# Patient Record
Sex: Female | Born: 2000 | Race: Black or African American | Hispanic: No | Marital: Single | Smoking: Former smoker
Health system: Southern US, Community
[De-identification: ages and names within clinical notes are randomized; demographics above are authoritative.]

## PROBLEM LIST (undated history)

## (undated) DIAGNOSIS — F419 Anxiety disorder, unspecified: Secondary | ICD-10-CM

## (undated) DIAGNOSIS — R519 Headache, unspecified: Secondary | ICD-10-CM

## (undated) DIAGNOSIS — J45909 Unspecified asthma, uncomplicated: Secondary | ICD-10-CM

## (undated) HISTORY — PX: NO PAST SURGERIES: SHX2092

---
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
## 2022-02-12 ENCOUNTER — Emergency Department (HOSPITAL_BASED_OUTPATIENT_CLINIC_OR_DEPARTMENT_OTHER): Payer: BC Managed Care – PPO

## 2022-02-12 ENCOUNTER — Encounter (HOSPITAL_BASED_OUTPATIENT_CLINIC_OR_DEPARTMENT_OTHER): Payer: Self-pay | Admitting: Emergency Medicine

## 2022-02-12 ENCOUNTER — Other Ambulatory Visit: Payer: Self-pay

## 2022-02-12 DIAGNOSIS — Y9367 Activity, basketball: Secondary | ICD-10-CM | POA: Diagnosis not present

## 2022-02-12 DIAGNOSIS — W51XXXA Accidental striking against or bumped into by another person, initial encounter: Secondary | ICD-10-CM | POA: Insufficient documentation

## 2022-02-12 DIAGNOSIS — S8991XA Unspecified injury of right lower leg, initial encounter: Secondary | ICD-10-CM | POA: Diagnosis present

## 2022-02-12 DIAGNOSIS — Z87891 Personal history of nicotine dependence: Secondary | ICD-10-CM | POA: Insufficient documentation

## 2022-02-12 DIAGNOSIS — M25561 Pain in right knee: Secondary | ICD-10-CM | POA: Diagnosis not present

## 2022-02-12 NOTE — ED Triage Notes (Addendum)
Pt states playing basketball today and another player ran into leg. Court medic/team trainer suspects ACL injury.

## 2022-02-13 ENCOUNTER — Emergency Department (HOSPITAL_BASED_OUTPATIENT_CLINIC_OR_DEPARTMENT_OTHER)
Admission: EM | Admit: 2022-02-13 | Discharge: 2022-02-13 | Disposition: A | Discharge status: Home / Self Care | Payer: BC Managed Care – PPO | Attending: Emergency Medicine | Admitting: Emergency Medicine

## 2022-02-13 DIAGNOSIS — Y9367 Activity, basketball: Secondary | ICD-10-CM

## 2022-02-13 DIAGNOSIS — S8991XA Unspecified injury of right lower leg, initial encounter: Secondary | ICD-10-CM

## 2022-02-13 MED ORDER — NAPROXEN 250 MG PO TABS
500.0000 mg | ORAL_TABLET | Freq: Once | ORAL | Status: AC
  Administered 2022-02-13: 500 mg via ORAL
  Filled 2022-02-13: qty 2

## 2022-02-13 MED ORDER — NAPROXEN 375 MG PO TABS: ORAL_TABLET | ORAL | 0 refills | Status: DC

## 2022-02-13 NOTE — ED Provider Notes (Signed)
[facility] Provider Note: Lowella Dell, MD, FACEP  CSN: [id number] MRN: [id number] ARRIVAL: 02/12/22 at 2329 ROOM: MH02/MH02   CHIEF COMPLAINT  Knee Injury   HISTORY OF PRESENT ILLNESS  02/13/22 2:17 AM Pamela Brewer is a 21 y.o. female who was playing basketball yesterday and another player ran into her right leg.  She is having pain in her right knee.  She rates the pain as a 10 out of 10, worse with movement.  Her trainer wrapped her right knee and an Ace bandage.  Her trainer suspects an ACL tear.   History reviewed. No pertinent past medical history.  History reviewed. No pertinent surgical history.  History reviewed. No pertinent family history.  Social History   Tobacco Use   Smoking status: Former    Types: Cigarettes   Smokeless tobacco: Former  Building services engineer Use: Never used  Substance Use Topics   Alcohol use: Never   Drug use: Never    Prior to Admission medications   Not on File    Allergies Sulfa antibiotics   REVIEW OF SYSTEMS  Negative except as noted here or in the History of Present Illness.   PHYSICAL EXAMINATION  Initial Vital Signs Blood pressure 127/75, pulse 92, temperature 98 F (36.7 C), temperature source Oral, resp. rate 18, height 5\' 6"  (1.676 m), weight 65.8 kg, last menstrual period 01/27/2022, SpO2 100 %.  Examination General: Well-developed, well-nourished female in no acute distress; appearance consistent with age of record HENT: normocephalic; atraumatic Eyes: Normal appearance Neck: supple Heart: regular rate and rhythm Lungs: clear to auscultation bilaterally Abdomen: soft; nondistended; nontender; bowel sounds present Extremities: No deformity; right knee pain on anterior drawer test and medial stress, no effusion or ecchymosis Neurologic: Awake, alert and oriented; motor function intact in all extremities and symmetric; no facial droop Skin: Warm and dry Psychiatric: Normal mood and  affect   RESULTS  Summary of this visit's results, reviewed and interpreted by myself:   EKG Interpretation  Date/Time:    Ventricular Rate:    PR Interval:    QRS Duration:   QT Interval:    QTC Calculation:   R Axis:     Text Interpretation:         Laboratory Studies: No results found for this or any previous visit (from the past 24 hour(s)). Imaging Studies: DG Knee Complete 4 Views Right  Result Date: 02/12/2022 CLINICAL DATA:  Knee injury EXAM: RIGHT KNEE - COMPLETE 4+ VIEW COMPARISON:  None. FINDINGS: No evidence of fracture, dislocation, or joint effusion. No evidence of arthropathy or other focal bone abnormality. Soft tissues are unremarkable. IMPRESSION: Negative. Electronically Signed   By: 02/14/2022 M.D.   On: 02/12/2022 23:59    ED COURSE and MDM  Nursing notes, initial and subsequent vitals signs, including pulse oximetry, reviewed and interpreted by myself.  Vitals:   02/12/22 2335 02/12/22 2335  BP:  127/75  Pulse:  92  Resp:  18  Temp:  98 F (36.7 C)  TempSrc:  Oral  SpO2:  100%  Weight: 65.8 kg   Height: 5\' 6"  (1.676 m)    Medications  naproxen (NAPROSYN) tablet 500 mg (has no administration in time range)    Knee examination is concerning for internal injury such as ACL tear or "unhappy triad".  We will place in a knee immobilizer and refer to orthopedics.  She already has crutches.  PROCEDURES  Procedures   ED DIAGNOSES  ICD-10-CM   1. Injury while playing basketball  Y93.67     2. Injury of right knee, initial encounter  S89.91XA          Candido Flott, Jonny Ruiz, MD 02/13/22 [phone]

## 2022-04-08 ENCOUNTER — Other Ambulatory Visit: Payer: Self-pay

## 2022-04-08 ENCOUNTER — Encounter (HOSPITAL_BASED_OUTPATIENT_CLINIC_OR_DEPARTMENT_OTHER): Payer: Self-pay | Admitting: Orthopaedic Surgery

## 2022-04-14 NOTE — Progress Notes (Signed)

## 2022-04-17 ENCOUNTER — Other Ambulatory Visit: Payer: Self-pay

## 2022-04-17 ENCOUNTER — Ambulatory Visit (HOSPITAL_BASED_OUTPATIENT_CLINIC_OR_DEPARTMENT_OTHER): Payer: BC Managed Care – PPO

## 2022-04-17 ENCOUNTER — Ambulatory Visit (HOSPITAL_BASED_OUTPATIENT_CLINIC_OR_DEPARTMENT_OTHER)
Admission: RE | Admit: 2022-04-17 | Discharge: 2022-04-17 | Disposition: A | Discharge status: Home / Self Care | Payer: BC Managed Care – PPO | Attending: Orthopaedic Surgery | Admitting: Orthopaedic Surgery

## 2022-04-17 ENCOUNTER — Encounter (HOSPITAL_BASED_OUTPATIENT_CLINIC_OR_DEPARTMENT_OTHER): Payer: Self-pay | Admitting: Orthopaedic Surgery

## 2022-04-17 ENCOUNTER — Encounter (HOSPITAL_BASED_OUTPATIENT_CLINIC_OR_DEPARTMENT_OTHER)
Admission: RE | Disposition: A | Discharge status: Home / Self Care | Payer: Self-pay | Source: Home / Self Care | Attending: Orthopaedic Surgery

## 2022-04-17 ENCOUNTER — Ambulatory Visit (HOSPITAL_BASED_OUTPATIENT_CLINIC_OR_DEPARTMENT_OTHER): Payer: BC Managed Care – PPO | Admitting: Certified Registered"

## 2022-04-17 DIAGNOSIS — Y9367 Activity, basketball: Secondary | ICD-10-CM | POA: Insufficient documentation

## 2022-04-17 DIAGNOSIS — S83511A Sprain of anterior cruciate ligament of right knee, initial encounter: Secondary | ICD-10-CM | POA: Diagnosis not present

## 2022-04-17 DIAGNOSIS — J45909 Unspecified asthma, uncomplicated: Secondary | ICD-10-CM | POA: Insufficient documentation

## 2022-04-17 DIAGNOSIS — S83281A Other tear of lateral meniscus, current injury, right knee, initial encounter: Secondary | ICD-10-CM | POA: Insufficient documentation

## 2022-04-17 DIAGNOSIS — W51XXXA Accidental striking against or bumped into by another person, initial encounter: Secondary | ICD-10-CM | POA: Insufficient documentation

## 2022-04-17 DIAGNOSIS — Z87891 Personal history of nicotine dependence: Secondary | ICD-10-CM | POA: Diagnosis not present

## 2022-04-17 HISTORY — DX: Unspecified asthma, uncomplicated: J45.909

## 2022-04-17 HISTORY — DX: Anxiety disorder, unspecified: F41.9

## 2022-04-17 HISTORY — DX: Headache, unspecified: R51.9

## 2022-04-17 HISTORY — PX: ANTERIOR CRUCIATE LIGAMENT REPAIR: SHX115

## 2022-04-17 LAB — POCT PREGNANCY, URINE: Preg Test, Ur: NEGATIVE

## 2022-04-17 SURGERY — REPAIR, KNEE, ACL
Anesthesia: Regional | Site: Knee | Laterality: Right

## 2022-04-17 MED ORDER — MIDAZOLAM HCL 2 MG/2ML IJ SOLN
2.0000 mg | Freq: Once | INTRAMUSCULAR | Status: AC
  Administered 2022-04-17: 2 mg via INTRAVENOUS

## 2022-04-17 MED ORDER — LIDOCAINE HCL (CARDIAC) PF 100 MG/5ML IV SOSY
PREFILLED_SYRINGE | INTRAVENOUS | Status: DC | PRN
  Administered 2022-04-17: 60 mg via INTRAVENOUS

## 2022-04-17 MED ORDER — ACETAMINOPHEN 500 MG PO TABS
1000.0000 mg | ORAL_TABLET | Freq: Once | ORAL | Status: AC
  Administered 2022-04-17: 1000 mg via ORAL

## 2022-04-17 MED ORDER — LIDOCAINE 2% (20 MG/ML) 5 ML SYRINGE
INTRAMUSCULAR | Status: AC
  Filled 2022-04-17: qty 5

## 2022-04-17 MED ORDER — FENTANYL CITRATE (PF) 100 MCG/2ML IJ SOLN
25.0000 ug | INTRAMUSCULAR | Status: DC | PRN
  Administered 2022-04-17: 50 ug via INTRAVENOUS
  Administered 2022-04-17: 25 ug via INTRAVENOUS

## 2022-04-17 MED ORDER — DEXAMETHASONE SODIUM PHOSPHATE 10 MG/ML IJ SOLN
INTRAMUSCULAR | Status: AC
  Filled 2022-04-17: qty 1

## 2022-04-17 MED ORDER — BUPIVACAINE HCL (PF) 0.5 % IJ SOLN
INTRAMUSCULAR | Status: DC | PRN
  Administered 2022-04-17: 30 mL

## 2022-04-17 MED ORDER — SODIUM CHLORIDE 0.9 % IR SOLN
Status: DC | PRN
  Administered 2022-04-17: 9000 mL

## 2022-04-17 MED ORDER — PROPOFOL 10 MG/ML IV BOLUS
INTRAVENOUS | Status: DC | PRN
  Administered 2022-04-17: 150 mg via INTRAVENOUS

## 2022-04-17 MED ORDER — ONDANSETRON HCL 4 MG PO TABS: 4.0000 mg | ORAL_TABLET | Freq: Three times a day (TID) | ORAL | 0 refills | Status: AC | PRN

## 2022-04-17 MED ORDER — SCOPOLAMINE 1 MG/3DAYS TD PT72
MEDICATED_PATCH | TRANSDERMAL | Status: AC
  Filled 2022-04-17: qty 1

## 2022-04-17 MED ORDER — AMISULPRIDE (ANTIEMETIC) 5 MG/2ML IV SOLN: 10.0000 mg | Freq: Once | INTRAVENOUS | Status: DC | PRN

## 2022-04-17 MED ORDER — FENTANYL CITRATE (PF) 100 MCG/2ML IJ SOLN
INTRAMUSCULAR | Status: AC
  Filled 2022-04-17: qty 2

## 2022-04-17 MED ORDER — DEXAMETHASONE SODIUM PHOSPHATE 10 MG/ML IJ SOLN
INTRAMUSCULAR | Status: DC | PRN
  Administered 2022-04-17: 10 mg via INTRAVENOUS

## 2022-04-17 MED ORDER — OXYCODONE HCL 5 MG PO TABS
ORAL_TABLET | ORAL | Status: AC
  Filled 2022-04-17: qty 1

## 2022-04-17 MED ORDER — FENTANYL CITRATE (PF) 100 MCG/2ML IJ SOLN
50.0000 ug | Freq: Once | INTRAMUSCULAR | Status: AC
  Administered 2022-04-17: 50 ug via INTRAVENOUS

## 2022-04-17 MED ORDER — ONDANSETRON HCL 4 MG/2ML IJ SOLN: 4.0000 mg | Freq: Once | INTRAMUSCULAR | Status: DC | PRN

## 2022-04-17 MED ORDER — ACETAMINOPHEN 500 MG PO TABS
ORAL_TABLET | ORAL | Status: AC
  Filled 2022-04-17: qty 2

## 2022-04-17 MED ORDER — MIDAZOLAM HCL 2 MG/2ML IJ SOLN
INTRAMUSCULAR | Status: AC
  Filled 2022-04-17: qty 2

## 2022-04-17 MED ORDER — ASPIRIN 81 MG PO CHEW: 81.0000 mg | CHEWABLE_TABLET | Freq: Two times a day (BID) | ORAL | 0 refills | Status: AC

## 2022-04-17 MED ORDER — VANCOMYCIN HCL 1000 MG IV SOLR
INTRAVENOUS | Status: DC | PRN
  Administered 2022-04-17: 1000 mg via TOPICAL

## 2022-04-17 MED ORDER — ONDANSETRON HCL 4 MG/2ML IJ SOLN
INTRAMUSCULAR | Status: AC
  Filled 2022-04-17: qty 2

## 2022-04-17 MED ORDER — OXYCODONE HCL 5 MG/5ML PO SOLN: 5.0000 mg | Freq: Once | ORAL | Status: AC | PRN

## 2022-04-17 MED ORDER — CEFAZOLIN SODIUM-DEXTROSE 2-4 GM/100ML-% IV SOLN
INTRAVENOUS | Status: AC
  Filled 2022-04-17: qty 100

## 2022-04-17 MED ORDER — SUGAMMADEX SODIUM 500 MG/5ML IV SOLN
INTRAVENOUS | Status: AC
  Filled 2022-04-17: qty 5

## 2022-04-17 MED ORDER — FENTANYL CITRATE (PF) 100 MCG/2ML IJ SOLN
INTRAMUSCULAR | Status: DC | PRN
  Administered 2022-04-17 (×2): 25 ug via INTRAVENOUS
  Administered 2022-04-17: 50 ug via INTRAVENOUS

## 2022-04-17 MED ORDER — SCOPOLAMINE 1 MG/3DAYS TD PT72
1.0000 | MEDICATED_PATCH | TRANSDERMAL | Status: DC
  Administered 2022-04-17: 1.5 mg via TRANSDERMAL

## 2022-04-17 MED ORDER — MELOXICAM 15 MG PO TABS: 15.0000 mg | ORAL_TABLET | Freq: Every day | ORAL | 0 refills | Status: DC

## 2022-04-17 MED ORDER — PROPOFOL 10 MG/ML IV BOLUS
INTRAVENOUS | Status: AC
  Filled 2022-04-17: qty 20

## 2022-04-17 MED ORDER — CEFAZOLIN SODIUM-DEXTROSE 2-4 GM/100ML-% IV SOLN
2.0000 g | INTRAVENOUS | Status: AC
  Administered 2022-04-17: 2 g via INTRAVENOUS

## 2022-04-17 MED ORDER — ACETAMINOPHEN 500 MG PO TABS: 1000.0000 mg | ORAL_TABLET | Freq: Three times a day (TID) | ORAL | 0 refills | Status: AC

## 2022-04-17 MED ORDER — LACTATED RINGERS IV SOLN: INTRAVENOUS | Status: DC

## 2022-04-17 MED ORDER — OXYCODONE HCL 5 MG PO TABS
5.0000 mg | ORAL_TABLET | Freq: Once | ORAL | Status: AC | PRN
  Administered 2022-04-17: 5 mg via ORAL

## 2022-04-17 MED ORDER — OXYCODONE HCL 5 MG PO TABS: ORAL_TABLET | ORAL | 0 refills | Status: AC

## 2022-04-17 MED ORDER — FENTANYL CITRATE (PF) 100 MCG/2ML IJ SOLN
INTRAMUSCULAR | Status: AC
Start: 2022-04-17 — End: ?
  Filled 2022-04-17: qty 2

## 2022-04-17 SURGICAL SUPPLY — 70 items
APL PRP STRL LF DISP 70% ISPRP (MISCELLANEOUS) ×1
BLADE AVERAGE 25X9 (BLADE) ×2 IMPLANT
BLADE SHAVER BONE 5.0X13 (MISCELLANEOUS) ×2 IMPLANT
BLADE SURG 10 STRL SS (BLADE) ×2 IMPLANT
BLADE SURG 15 STRL LF DISP TIS (BLADE) ×1 IMPLANT
BLADE SURG 15 STRL SS (BLADE) ×2
BNDG ELASTIC 6X5.8 VLCR STR LF (GAUZE/BANDAGES/DRESSINGS) IMPLANT
BONE TUNNEL PLUG CANNULATED (MISCELLANEOUS) ×2 IMPLANT
BURR OVAL 8 FLU 4.0X13 (MISCELLANEOUS) IMPLANT
CHLORAPREP W/TINT 26 (MISCELLANEOUS) ×2 IMPLANT
COLLECTOR GRAFT TISSUE (SYSTAGENIX WOUND MANAGEMENT) ×2
COOLER ICEMAN CLASSIC (MISCELLANEOUS) ×2 IMPLANT
COVER BACK TABLE 60X90IN (DRAPES) ×2 IMPLANT
CUFF TOURN SGL QUICK 34 (TOURNIQUET CUFF)
CUFF TRNQT CYL 34X4.125X (TOURNIQUET CUFF) IMPLANT
DISSECTOR 3.5MM X 13CM CVD (MISCELLANEOUS) IMPLANT
DISSECTOR 4.0MMX13CM CVD (MISCELLANEOUS) ×2 IMPLANT
DRAPE IMP U-DRAPE 54X76 (DRAPES) IMPLANT
DRAPE U-SHAPE 47X51 STRL (DRAPES) ×2 IMPLANT
DRAPE-T ARTHROSCOPY W/POUCH (DRAPES) ×2 IMPLANT
ELECT REM PT RETURN 9FT ADLT (ELECTROSURGICAL) ×2
ELECTRODE REM PT RTRN 9FT ADLT (ELECTROSURGICAL) ×1 IMPLANT
GAUZE SPONGE 4X4 12PLY STRL (GAUZE/BANDAGES/DRESSINGS) ×4 IMPLANT
GLOVE BIO SURGEON STRL SZ 6.5 (GLOVE) ×2 IMPLANT
GLOVE BIOGEL PI IND STRL 6.5 (GLOVE) ×1 IMPLANT
GLOVE BIOGEL PI IND STRL 8 (GLOVE) ×1 IMPLANT
GLOVE BIOGEL PI INDICATOR 6.5 (GLOVE) ×1
GLOVE BIOGEL PI INDICATOR 8 (GLOVE) ×1
GLOVE ECLIPSE 8.0 STRL XLNG CF (GLOVE) ×2 IMPLANT
GOWN STRL REUS W/ TWL LRG LVL3 (GOWN DISPOSABLE) ×2 IMPLANT
GOWN STRL REUS W/TWL LRG LVL3 (GOWN DISPOSABLE) ×4
GOWN STRL REUS W/TWL XL LVL3 (GOWN DISPOSABLE) ×2 IMPLANT
IMMOBILIZER KNEE 22 UNIV (SOFTGOODS) IMPLANT
IMMOBILIZER KNEE 24 THIGH 36 (MISCELLANEOUS) IMPLANT
IMMOBILIZER KNEE 24 UNIV (MISCELLANEOUS)
IMP SYS 2ND FIX PEEK 4.75X19.1 (Miscellaneous) ×2 IMPLANT
IMPL SYS 2ND FX PEEK 4.75X19.1 (Miscellaneous) IMPLANT
IMPL TIGHTROP FIBERTAG ACL (Orthopedic Implant) IMPLANT
IMPLANT TIGHTROPE FIBERTAG ACL (Orthopedic Implant) ×2 IMPLANT
IV NS IRRIG 3000ML ARTHROMATIC (IV SOLUTION) ×8 IMPLANT
KIT TRANSTIBIAL (DISPOSABLE) ×2 IMPLANT
KNIFE GRAFT ACL 10MM 5952 (MISCELLANEOUS) ×2 IMPLANT
KNIFE GRAFT ACL 9MM (MISCELLANEOUS) IMPLANT
MANIFOLD NEPTUNE II (INSTRUMENTS) ×2 IMPLANT
NS IRRIG 1000ML POUR BTL (IV SOLUTION) ×2 IMPLANT
PACK ARTHROSCOPY DSU (CUSTOM PROCEDURE TRAY) ×2 IMPLANT
PACK BASIN DAY SURGERY FS (CUSTOM PROCEDURE TRAY) ×2 IMPLANT
PAD COLD SHLDR WRAP-ON (PAD) ×3 IMPLANT
PENCIL SMOKE EVACUATOR (MISCELLANEOUS) IMPLANT
PORT APPOLLO RF 90DEGREE MULTI (SURGICAL WAND) IMPLANT
PORTAL SKID DEVICE (INSTRUMENTS) IMPLANT
SCREW INTERFERENCE 7X25MM (Screw) ×1 IMPLANT
SCREW SHEATHED INTERF 7X20 (Screw) ×1 IMPLANT
SCREW SHEATHED INTERF 8X20MM (Screw) ×1 IMPLANT
SLEEVE SCD COMPRESS KNEE MED (STOCKING) IMPLANT
SPIKE FLUID TRANSFER (MISCELLANEOUS) IMPLANT
SPONGE T-LAP 4X18 ~~LOC~~+RFID (SPONGE) ×2 IMPLANT
STRIP CLOSURE SKIN 1/2X4 (GAUZE/BANDAGES/DRESSINGS) ×2 IMPLANT
SUT FIBERWIRE #2 38 T-5 BLUE (SUTURE) ×10
SUT MNCRL AB 4-0 PS2 18 (SUTURE) IMPLANT
SUT VIC AB 0 CT1 27 (SUTURE) ×2
SUT VIC AB 0 CT1 27XBRD ANBCTR (SUTURE) ×1 IMPLANT
SUT VIC AB 3-0 SH 27 (SUTURE) ×2
SUT VIC AB 3-0 SH 27X BRD (SUTURE) ×1 IMPLANT
SUTURE FIBERWR #2 38 T-5 BLUE (SUTURE) ×4 IMPLANT
TISSUE GRAFT COLLECTOR (SYSTAGENIX WOUND MANAGEMENT) IMPLANT
TOWEL GREEN STERILE FF (TOWEL DISPOSABLE) ×4 IMPLANT
TUBE CONNECTING 20X1/4 (TUBING) ×2 IMPLANT
TUBE SUCTION HIGH CAP CLEAR NV (SUCTIONS) ×2 IMPLANT
TUBING ARTHROSCOPY IRRIG 16FT (MISCELLANEOUS) ×2 IMPLANT

## 2022-04-17 NOTE — H&P (Signed)
? ? ?PREOPERATIVE H&P ? ?Chief Complaint: right knee ACL tear ? ?HPI: ?Pamela Brewer is a 21 y.o. female who is scheduled for, Procedure(s): ?ANTERIOR CRUCIATE LIGAMENT (ACL) REPAIR/arthroscopic.  ? 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  She felt a pop and giving way with her knee.  She was unable to continue to play.  She was seen in a local emergency department and x-rays were negative.  She was given a brace and crutches. She can bear weight, but limping; significant swelling.   ? ?Symptoms are rated as moderate to severe, and have been worsening.  This is significantly impairing activities of daily living.   ? ?Please see clinic note for further details on this patient's care.   ? ?She has elected for surgical management.  ? ?Past Medical History:  ?Diagnosis Date  ? Anxiety   ? Asthma   ? Headache   ? ?Past Surgical History:  ?Procedure Laterality Date  ? NO PAST SURGERIES    ? ?Social History  ? ?Socioeconomic History  ? Marital status: Single  ?  Spouse name: Not on file  ? Number of children: Not on file  ? Years of education: Not on file  ? Highest education level: Not on file  ?Occupational History  ? Not on file  ?Tobacco Use  ? Smoking status: Former  ?  Types: Cigarettes  ? Smokeless tobacco: Former  ?Vaping Use  ? Vaping Use: Never used  ?Substance and Sexual Activity  ? Alcohol use: Never  ? Drug use: Never  ? Sexual activity: Never  ?  Birth control/protection: None  ?Other Topics Concern  ? Not on file  ?Social History Narrative  ? Not on file  ? ?Social Determinants of Health  ? ?Financial Resource Strain: Not on file  ?Food Insecurity: Not on file  ?Transportation Needs: Not on file  ?Physical Activity: Not on file  ?Stress: Not on file  ?Social Connections: Not on file  ? ?History reviewed. No pertinent family history. ?Allergies  ?Allergen Reactions   ? Sulfa Antibiotics   ? ?Prior to Admission medications   ?Medication Sig Start Date End Date Taking? Authorizing Provider  ?amitriptyline (ELAVIL) 10 MG tablet Take 10 mg by mouth at bedtime.   Yes [provider]  ?naproxen (NAPROSYN) 375 MG tablet Take 1 tablet twice daily as needed for knee pain. 02/13/22  Yes Molpus, Jenny Reichmann, MD  ? ? ?ROS: All other systems have been reviewed and were otherwise negative with the exception of those mentioned in the HPI and as above. ? ?Physical Exam: ?General: Alert, no acute distress ?Cardiovascular: No pedal edema ?Respiratory: No cyanosis, no use of accessory musculature ?GI: No organomegaly, abdomen is soft and non-tender ?Skin: No lesions in the area of chief complaint ?Neurologic: Sensation intact distally ?Psychiatric: Patient is competent for consent with normal mood and affect ?Lymphatic: No axillary or cervical lymphadenopathy ? ?MUSCULOSKELETAL:  ?Exam of the right knee shows a 1+ effusion; positive Lachman's, positive pivot slip.  No laxity to MCL stress.  Mild tenderness of the lateral joint line.  Positive McMurray's.  She is neurovascularly intact distally.  Negative patellar apprehension. ? ?Imaging: ?MRI demonstrates a complete ACL rupture and bone bruise, appropriate for this.  There is an atypical appearance of the lateral meniscus but no obvious discreet tear.  ? ?Assessment: ?right knee ACL tear ? ?  Plan: ?Plan for Procedure(s): ?ANTERIOR CRUCIATE LIGAMENT (ACL) REPAIR/arthroscopic ? ?The risks benefits and alternatives were discussed with the patient including but not limited to the risks of nonoperative treatment, versus surgical intervention including infection, bleeding, nerve injury,  blood clots, cardiopulmonary complications, morbidity, mortality, among others, and they were willing to proceed.  ? ?The patient acknowledged the explanation, agreed to proceed with the plan and consent was signed.  ? ?Operative Plan: Right knee scope with ACL  reconstruction with BTB autograft and possible lateral meniscus repair versus meniscectomy ?Discharge Medications: Standard ?DVT Prophylaxis: Aspirin ?Physical Therapy: Outpatient ?Special Discharge needs: [place]. Ice[place] ? ? ?Ethelda Chick, PA-C ? ?04/17/2022 ?6:37 AM ? ?

## 2022-04-17 NOTE — Discharge Instructions (Addendum)
Pamela Marrowax Varkey MD, MPH ?Alfonse Alpersaroline McBane, PA-C ?[facility] ?[address], [address] ?[phone] (tel)   [phone] (fax) ? ? ?POST-OPERATIVE INSTRUCTIONS - ACL RECONSTRUCTION ? ?WOUND CARE ?You may remove the Operative Dressing on Post-Op Day #3 (72hrs after surgery).   ?Leave steri strips in place.   ?If you feel more comfortable with it you can leave all dressings in place till your 1 week follow-up appointment.   ?KEEP THE INCISIONS CLEAN AND DRY. ?An ACE wrap may be used to control swelling, do not wrap this too tight.  If the initial ACE wrap feels too tight or constricting you may loosen it. ?There may be a small amount of fluid/bleeding leaking at the surgical site.  ?This is normal; the knee is filled with fluid during the procedure and can leak for 24-48hrs after surgery.  ?You may change/reinforce the bandage as needed.  ?Use the Cryocuff, GameReady or Ice as often as possible for the first 3-4 days, then as needed for pain relief. Always keep a towel, ACE wrap or other barrier between the cooling unit and your skin.  ?You may shower on Post-Op Day #3. Gently pat the area dry.  ?Do not soak the knee in water.  ?Do not go swimming in the pool or ocean until 4 weeks after surgery or when otherwise instructed. ? ?BRACE/AMBULATION ?Your leg will be placed in a brace post-operatively.  ?You will need to wear your brace at all times until we discuss it further.  ?You may remove your brace for hygiene only ?It should be locked in full extension (0 degrees) if adjustable.   ?You will be instructed on further bracing after your first visit. ?Use crutches for comfort but you can put your full weight on the leg as tolerated. ? ?PHYSICAL THERAPY ?- You have a physical therapy appointment at [facility] (across the hall from our office) on Tuesday, May 2nd at 3 pm ? ?REGIONAL ANESTHESIA (NERVE BLOCKS) ?- The anesthesia team may have performed a nerve block for you if safe in the setting of your care.   This is a great tool used to minimize pain.  Typically the block may start wearing off overnight.  This can be a challenging period but please utilize your as needed pain medications to try and manage this period and know it will be a brief transition as the nerve block wears completely  ? ?POST-OP MEDICATIONS- Multimodal approach to pain control ?In general your pain will be controlled with a combination of substances.  Prescriptions unless otherwise discussed are electronically sent to your pharmacy.  This is a carefully made plan we use to minimize narcotic use.  ?   ?Meloxicam - Anti-inflammatory medication taken on a scheduled basis ?Acetaminophen - Non-narcotic pain medicine taken on a scheduled basis  ?Oxycodone - This is a strong narcotic, to be used only on an ?as needed? basis for pain. ?Aspirin 81mg  - This medicine is used to minimize the risk of blood clots after surgery. ?Zofran - take as needed for nausea ? ?FOLLOW-UP ?Please call the office to schedule a follow-up appointment for your incision check if you do not already have one, 7-10 days post-operatively. ?IF YOU HAVE ANY QUESTIONS, PLEASE FEEL FREE TO CALL OUR OFFICE. ? ?REGIONAL ANESTHESIA (NERVE BLOCKS) ?- The anesthesia team may have performed a nerve block for you this is a great tool used to minimize pain.   ?- The block may start wearing off overnight (between 8-24 hours postop) ?-  When the block wears off, your pain may go from nearly zero to the pain you would have had postop without the block. ?- This is an abrupt transition but nothing dangerous is happening.   ?- This can be a challenging period but utilize your as needed pain medications to try and manage this period. ?- We suggest you use the pain medication the first night prior to going to bed, to ease this transition.  ?- You may take an extra dose of narcotic when this happens if needed ? ? ?HELPFUL INFORMATION ? ?Keep your leg elevated to decrease swelling, which will then in  turn decrease your pain. I would elevate the foot of your bed by putting a couple of couch pillows between your mattress and box spring. I would not keep pillow directly under your ankle. ? ?You must wear the brace locked while sleeping and ambulating until follow-up.  ? ?There will be MORE swelling on days 1-3 than there is on the day of surgery.  This also is normal. The swelling will decrease with the anti-inflammatory medication, ice and keeping it elevated. The swelling will make it more difficult to bend your knee. As the swelling goes down your motion will become easier ? ?You may develop swelling and bruising that extends from your knee down to your calf and perhaps even to your foot over the next week. Do not be alarmed. This too is normal, and it is due to gravity ? ?There may be some numbness adjacent to the incision site. This may last for 6-12 months or longer in some patients and is expected. ? ?You may return to sedentary work/school in the next couple of days when you feel up to it. You will need to keep your leg elevated as much as possible  ? ?You should wean off your narcotic medicines as soon as you are able.  Most patients will be off or using minimal narcotics before their first postop appointment.  ? ?We suggest you use the pain medication the first night prior to going to bed, in order to ease any pain when the anesthesia wears off. You should avoid taking pain medications on an empty stomach as it will make you nauseous. ? ?Do not drink alcoholic beverages or take illicit drugs when taking pain medications. ? ?It is against the law to drive while taking narcotics. You cannot drive if your Right leg is in brace locked in extension. ? ?Pain medication may make you constipated.  Below are a few solutions to try in this order: ?Decrease the amount of pain medication if you aren?t having pain. ?Drink lots of decaffeinated fluids. ?Drink prune juice and/or each dried prunes ? ?If the first 3 don?t  work start with additional solutions ?Take Colace - an over-the-counter stool softener ?Take Senokot - an over-the-counter laxative ?Take Miralax - a stronger over-the-counter laxative ? ?For more information including helpful videos and documents visit our website:  ? ?https://www.[email or web address].com/patient-information.html ? ? ? ? ? ? ? ?Post Anesthesia Home Care Instructions ? ?Activity: ?Get plenty of rest for the remainder of the day. A responsible individual must stay with you for 24 hours following the procedure.  ?For the next 24 hours, DO NOT: ?-Drive a car ?-Advertising copywriter ?-Drink alcoholic beverages ?-Take any medication unless instructed by your physician ?-Make any legal decisions or sign important papers. ? ?Meals: ?Start with liquid foods such as gelatin or soup. Progress to regular foods as tolerated. Avoid greasy, spicy, heavy foods. If  nausea and/or vomiting occur, drink only clear liquids until the nausea and/or vomiting subsides. Call your physician if vomiting continues. ? ?Special Instructions/Symptoms: ?Your throat may feel dry or sore from the anesthesia or the breathing tube placed in your throat during surgery. If this causes discomfort, gargle with warm salt water. The discomfort should disappear within 24 hours. ? ?If you had a scopolamine patch placed behind your ear for the management of post- operative nausea and/or vomiting: ? ?1. The medication in the patch is effective for 72 hours, after which it should be removed.  Wrap patch in a tissue and discard in the trash. Wash hands thoroughly with soap and water. ?2. You may remove the patch earlier than 72 hours if you experience unpleasant side effects which may include dry mouth, dizziness or visual disturbances. ?3. Avoid touching the patch. Wash your hands with soap and water after contact with the patch. ?    ? ? ? ? ?Regional Anesthesia Blocks ? ?1. Numbness or the inability to move the "blocked" extremity may last from 3-48 hours  after placement. The length of time depends on the medication injected and your individual response to the medication. If the numbness is not going away after 48 hours, call your surgeon. ? ?2. The ex

## 2022-04-17 NOTE — Anesthesia Procedure Notes (Signed)
Anesthesia Regional Block: Adductor canal block  ? ?Pre-Anesthetic Checklist: , timeout performed,  Correct Patient, Correct Site, Correct Laterality,  Correct Procedure, Correct Position, site marked,  Risks and benefits discussed,  Surgical consent,  Pre-op evaluation,  At surgeon's request and post-op pain management ? ?Laterality: Right ? ?Prep: chloraprep     ?  ?Needles:  ?Injection technique: Single-shot ? ?Needle Type: Echogenic Stimulator Needle   ? ? ?Needle Length: 10cm  ?Needle Gauge: 20  ? ? ? ?Additional Needles: ? ? ?Procedures:,,,, ultrasound used (permanent image in chart),,    ?Narrative:  ?Start time: 04/17/2022 12:05 PM ?End time: 04/17/2022 12:10 PM ?Injection made incrementally with aspirations every 5 mL. ? ?Performed by: Personally  ?Anesthesiologist: Mellody Dance, MD ? ?Additional Notes: ?Functioning IV was confirmed and monitors were applied.  Sterile prep and drape,hand hygiene and sterile gloves were used. Ultrasound guidance: relevant anatomy identified, needle position confirmed, local anesthetic spread visualized around nerve(s)., vascular puncture avoided. Negative aspiration and negative test dose prior to incremental administration of local anesthetic. The patient tolerated the procedure well. ? ? ? ? ? ? ?

## 2022-04-17 NOTE — Progress Notes (Signed)
AssistedDr. Bass with right, adductor canal, ultrasound guided block. Side rails up, monitors on throughout procedure. See vital signs in flow sheet. Tolerated Procedure well.  

## 2022-04-17 NOTE — Anesthesia Preprocedure Evaluation (Addendum)
Anesthesia Evaluation  ?Patient identified by MRN, date of birth, ID band ? ?Reviewed: ?Allergy & Precautions, NPO status , Patient's Chart, lab work & pertinent test results ? ?Airway ?Mallampati: II ? ?TM Distance: >3 FB ?Neck ROM: Full ? ? ? Dental ?no notable dental hx. ? ?  ?Pulmonary ?asthma , former smoker,  ?  ?Pulmonary exam normal ?breath sounds clear to auscultation ? ? ? ? ? ? Cardiovascular ?negative cardio ROS ?Normal cardiovascular exam ?Rhythm:Regular Rate:Normal ? ? ?  ?Neuro/Psych ? Headaches, PSYCHIATRIC DISORDERS Anxiety   ? GI/Hepatic ?negative GI ROS, Neg liver ROS,   ?Endo/Other  ?negative endocrine ROS ? Renal/GU ?negative Renal ROS  ?negative genitourinary ?  ?Musculoskeletal ?negative musculoskeletal ROS ?(+)  ? Abdominal ?  ?Peds ?negative pediatric ROS ?(+)  Hematology ?negative hematology ROS ?(+)   ?Anesthesia Other Findings ?ACL tear ? Reproductive/Obstetrics ?negative OB ROS ? ?  ? ? ? ? ? ? ? ? ? ? ? ? ? ?  ?  ? ? ? ? ? ? ? ?Anesthesia Physical ?Anesthesia Plan ? ?ASA: 2 ? ?Anesthesia Plan: General and Regional  ? ?Post-op Pain Management:   ? ?Induction: Intravenous ? ?PONV Risk Score and Plan: 3 and Treatment may vary due to age or medical condition, Scopolamine patch - Pre-op, Ondansetron, Dexamethasone and Midazolam ? ?Airway Management Planned: LMA ? ?Additional Equipment: None ? ?Intra-op Plan:  ? ?Post-operative Plan: Extubation in OR ? ?Informed Consent: I have reviewed the patients History and Physical, chart, labs and discussed the procedure including the risks, benefits and alternatives for the proposed anesthesia with the patient or authorized representative who has indicated his/her understanding and acceptance.  ? ? ? ?Dental advisory given ? ?Plan Discussed with: CRNA, Anesthesiologist and Surgeon ? ?Anesthesia Plan Comments: (Adductor canal block. GA/LMA. Tanna Furry, MD  ?)  ? ? ? ? ? ?Anesthesia Quick Evaluation ? ?

## 2022-04-17 NOTE — Transfer of Care (Signed)
Immediate Anesthesia Transfer of Care Note ? ?Patient: Pamela Brewer ? ?Procedure(s) Performed: ANTERIOR CRUCIATE LIGAMENT (ACL) REPAIR/arthroscopic (Right: Knee) ? ?Patient Location: PACU ? ?Anesthesia Type:GA combined with regional for post-op pain ? ?Level of Consciousness: awake, alert , oriented, drowsy and patient cooperative ? ?Airway & Oxygen Therapy: Patient Spontanous Breathing and Patient connected to face mask oxygen ? ?Post-op Assessment: Report given to RN and Post -op Vital signs reviewed and stable ? ?Post vital signs: Reviewed and stable ? ?Last Vitals:  ?Vitals Value Taken Time  ?BP 119/50 04/17/22 1501  ?Temp    ?Pulse 100 04/17/22 1505  ?Resp 16 04/17/22 1505  ?SpO2 100 % 04/17/22 1505  ?Vitals shown include unvalidated device data. ? ?Last Pain:  ?Vitals:  ? 04/17/22 1135  ?TempSrc: Oral  ?PainSc: 0-No pain  ?   ? ?Patients Stated Pain Goal: 4 (04/17/22 1135) ? ?Complications: No notable events documented. ?

## 2022-04-17 NOTE — Anesthesia Procedure Notes (Signed)
Procedure Name: LMA Insertion ?Date/Time: 04/17/2022 12:57 PM ?Performed by: Lauralyn Primes, CRNA ?Pre-anesthesia Checklist: Patient identified, Emergency Drugs available, Suction available and Patient being monitored ?Patient Re-evaluated:Patient Re-evaluated prior to induction ?Oxygen Delivery Method: Circle system utilized ?Preoxygenation: Pre-oxygenation with 100% oxygen ?Induction Type: IV induction ?Ventilation: Mask ventilation without difficulty ?LMA: LMA inserted ?LMA Size: 4.0 ?Number of attempts: 1 ?Airway Equipment and Method: Bite block ?Placement Confirmation: positive ETCO2 ?Tube secured with: Tape ?Dental Injury: Teeth and Oropharynx as per pre-operative assessment  ? ? ? ? ?

## 2022-04-17 NOTE — Interval H&P Note (Signed)
All questions answered, patient wants to proceed with procedure. ? ?

## 2022-04-18 ENCOUNTER — Encounter (HOSPITAL_BASED_OUTPATIENT_CLINIC_OR_DEPARTMENT_OTHER): Payer: Self-pay | Admitting: Orthopaedic Surgery

## 2022-04-18 NOTE — Op Note (Signed)
Orthopaedic Surgery Operative Note (CSN: [id number]) ? ?[name]  [birth year] ?Date of Surgery: 04/17/2022 ? ? ?Diagnoses:  ?right knee ACL tear with lateral meniscus tear ? ?Procedure: ?Right BTB ACL reconstruction with tight rope femoral fixation, 10 mm autograft ?  ?Operative Finding ?Exam under anesthesia: Grossly positive Lachman no other obvious ligamentous injuries, range of motion is full ?Suprapatellar pouch: Normal ?Patellofemoral Compartment: Normal ?Medial Compartment: Normal ?Lateral Compartment: Central tear of the lateral meniscus, 10% total meniscal volume resected ?Intercondylar Notch: Complete tear of the ACL with scarring to the PCL. ? ?Successful completion of the planned procedure.  Our tunnels were drilled however we are probably only a millimeter off the back wall and we had some fragmentation of the back wall which was worrisome for screw based fixation as we did not get good purchase with the femoral screw.  We switched after passing her graft to tight rope fixation.  We had robust purchase with our tight rope on the lateral cortex and typical fixation in the tibia.  There was some graft tunnel mismatch and thus we backed up our fixation of the tibia with a swivel lock. ? ?Post-operative plan: The patient will be weightbearing to tolerance in a knee immobilizer/hinged brace locked in extension.  The patient will be discharged home.  DVT prophylaxis Aspirin 81 mg twice daily for 6 weeks.  Pain control with PRN pain medication preferring oral medicines.  Follow up plan will be scheduled in approximately 7 days for incision check and XR. ? ?Post-Op Diagnosis: Same ?Surgeons:Primary: Bjorn Pippin, MD ?Assistants:Caroline McBane PA-C ?Location: [facility] OR ROOM 6 ?Anesthesia: General and adductor canal ?Antibiotics: Ancef 2 g with local vancomycin powder 1 g at the surgical site ?Tourniquet time:  ?Total Tourniquet Time Documented: ?Thigh (Right) - 95 minutes ?Total: Thigh (Right) - 95  minutes ? ?Estimated Blood Loss: Minimal ?Complications: None ?Specimens: None ?Implants: ?Implant Name Type Inv. Item Serial No. Manufacturer Lot No. LRB No. Used Action  ?SCREW SHEATHED INTERF 8X20MM - [id number] Screw SCREW SHEATHED INTERF 8X20MM [id number] [employer] [id number] Right 1 Implanted  ?Dorene Ar ACL - [id number] Orthopedic Implant IMPLANT TIGHTROPE [employer] [id number] Right 1 Implanted  ?IMP SYS 2ND FIX PEEK 4.75X19.1 - [id number] Miscellaneous IMP SYS 2ND FIX PEEK 4.75X19.1  [employer] [id number] Right 1 Implanted  ? ? ?Indications for Surgery:   ?Pamela Brewer is a 21 y.o. female with ACL rupture.  Benefits and risks of operative and nonoperative management were discussed prior to surgery with patient/guardian(s) and informed consent form was completed.  Specific risks including infection, need for additional surgery, rerupture, stiffness amongst others. ? ? ?Procedure:   ?The patient was identified properly. Informed consent was obtained and the surgical site was marked. The patient was taken up to suite where general anesthesia was induced. The patient was placed in the supine position with a post against the surgical leg and a nonsterile tourniquet applied. The surgical leg was then prepped and draped usual sterile fashion.  A standard surgical timeout was performed.  2 standard anterior portals were made and diagnostic arthroscopy performed. Please note the findings as noted above. ? ?We began by making an incision along the medial third of the patellar tendon in line with the tendon itself starting at the level of the distal pole of the patella ending 3 cm distal to the insertion on the tubercle. We carried the incision down sharply achieving hemostasis 3 progressed identifying the tissue plane of the peritenon. The  created skin flaps medially and laterally taking care to avoid damage to the superficial skin. This point the peritenon was incised sharply in line with the  tendon and again flaps are created exposing the medial and lateral borders of the tendon. We then took care to ensure that there is appropriate visualization for forearm harvest within our incision using a mobile window technique. ? ?We then used a double bladed scalpel incised the tendon longitudinally 10 mm wide. This incision within the tendon was carried proximal and distally onto the tubercle and proximal wound patella to create a 25 mm bone block from patella and a 27 mm bone block from the tibia.  We made our longitudinal cuts with a saw taking care to not go deeper than 10 mm and rather than make a transverse patella cut we made a bullet style tapered cut at the proximal aspect of the patella to avoid the risk for transverse patella fracture.  The harvest went without issue and graft was taken to the back table.   ? ?This point we closed the defect in the patellar tendon after identifying that there was appropriate medial and lateral tendon still intact.  ? ?We began arthroscopy and made our lateral and medial portals within our incision on each side of the tendon. Fat pad was resected and diagnostic arthroscopy performed with the findings listed above.  ? ?The anterior cruciate ligament stump was debrided utilizing a shaver taking great care to preserve the remnant stump on the femur and the tibia for localization of our tunnels. Once the remnant anterior cruciate ligament was removed and we obtained appropriate visualization by performing a small notchplasty and confirmed that we had indeed identify the over-the-top position. We made small marks at the location of the aperture of the tibial and femoral tunnels and double checked our location prior to drilling. ? ?We began with a femoral tunnel.  We had taken care to make a far medial and low anteromedial portal with spinal needle localization to ensure that we can get appropriate position on the lateral wall of the notch from an anterior medial portal  drilling technique.  We used a 7 mm offset guide with the knee at 90 degrees of flexion to mark in the position of the old ACL stump.  We then switched our camera to the medial portal and checked that our position was appropriate compared to the back wall.  At that point we hyperflexed the knee and used the 7 mm offset guide again primarily as a sheath to freehand place our wire at the aforementioned mark taking care to exit anterior to the mid femoral line to avoid posterior wall blowout.  Once the wire was advanced through the skin we clamped it and then placed a 10 mm acorn style reamer by hand ensuring that we did not interfere with the medial femoral condyle.  Once it entered the notch we are able to connect it to a reamer and ream to 30 mm of tunnel depth. ? ?We used an [employer] GraftNet device to harvest with a shaver any of the bony debris and cleared bony debris from the tunnel to ensure that we had an easy pass.  We again checked from the medial portal to ensure that we only had a 1 mm posterior wall and appropriate position of our tunnel at the 10 position. ? ?At this point we advanced our Beath pin and shuttled a #2 FiberWire for eventual graft passage. ? ?We turned our attention to  the tibial tunnel.  We cleared the old ACL stump soft tissue.  We freehand drilled our tibial tunnel using a 2.4 Beath pin and appropriate anterior medial tibial aperture as well as a inferior articular aperture.  We took great care to ensure that our wire was positioned appropriately.  We then reamed with the barrel reamer through our ACL harvest incision taking care to protect the skin.  We harvested bone as we reamed and then completed the reaming into the joint.  Any soft tissue was cleared from the apertures of the tunnel. ? ?We finally checked our tunnel position and apertures once more and were happy were these and proceeded with graft shuttling. ? ?The graft was shuttled in typical fashion and we were able to visualize  entering the femoral tunnel.  We ensured that the cancellous surface of the graft was anterior moving the collagen of the graft as far posterior as possible.  Before advancing the graft into the femoral socket we pl

## 2022-04-18 NOTE — Anesthesia Postprocedure Evaluation (Signed)
Anesthesia Post Note ? ?Patient: Pamela Brewer ? ?Procedure(s) Performed: ANTERIOR CRUCIATE LIGAMENT (ACL) REPAIR/arthroscopic (Right: Knee) ? ?  ? ?Patient location during evaluation: PACU ?Anesthesia Type: Regional and General ?Level of consciousness: awake ?Pain management: pain level controlled ?Vital Signs Assessment: post-procedure vital signs reviewed and stable ?Respiratory status: spontaneous breathing and respiratory function stable ?Cardiovascular status: stable ?Postop Assessment: no apparent nausea or vomiting ?Anesthetic complications: no ? ? ?No notable events documented. ? ?Last Vitals:  ?Vitals:  ? 04/17/22 1539 04/17/22 1550  ?BP: 123/75 128/72  ?Pulse: 88 94  ?Resp: 11 16  ?Temp:  (!) 36.4 ?C  ?SpO2: 98% 99%  ?  ?Last Pain:  ?Vitals:  ? 04/17/22 1550  ?TempSrc:   ?PainSc: 5   ? ? ?  ?  ?  ?  ?  ?  ? ?Mellody Dance ? ? ? ? ?

## 2023-06-16 ENCOUNTER — Ambulatory Visit
Admission: EM | Admit: 2023-06-16 | Discharge: 2023-06-16 | Disposition: A | Discharge status: Home / Self Care | Payer: Medicaid Other | Attending: Emergency Medicine | Admitting: Emergency Medicine

## 2023-06-16 DIAGNOSIS — R202 Paresthesia of skin: Secondary | ICD-10-CM

## 2023-06-16 DIAGNOSIS — R519 Headache, unspecified: Secondary | ICD-10-CM

## 2023-06-16 LAB — CBG MONITORING, ED: CBG: 84

## 2023-06-16 NOTE — ED Triage Notes (Signed)
Pt presents to UC c/o headache and bilateral feet swelling onset this morning when she woke up. Pt also reports tingling at both feet.

## 2023-06-16 NOTE — Discharge Instructions (Addendum)
Please follow up with your PCP, do not skip meals, drink plenty of water. May take tylenol as label directed for headache.Return as needed. Go to Er for new or worsening issues or concerns.

## 2023-06-16 NOTE — ED Provider Notes (Signed)
[facility]    CSN: [id number] Arrival date & time: 06/16/23  1428      History   Chief Complaint Chief Complaint  Patient presents with   Foot Swelling   Headache    HPI Pamela Brewer is a 22 y.o. female.   22 year old female, Pamela Brewer, presents to urgent care for complaint of headache last night and bilateral feet swelling this morning when she woke up.  Patient currently states she has no complaints but mom wanted her to get checked for diabetes since she (mom) has diabetes.  Patient states she has not eaten anything since yesterday, patient drink some water earlier today.  The history is provided by the patient. No language interpreter was used.    Past Medical History:  Diagnosis Date   Anxiety    Asthma    Headache     Patient Active Problem List   Diagnosis Date Noted   Acute nonintractable headache 06/16/2023   Paresthesia of both feet 06/16/2023    Past Surgical History:  Procedure Laterality Date   ANTERIOR CRUCIATE LIGAMENT REPAIR Right 04/17/2022   Procedure: ANTERIOR CRUCIATE LIGAMENT (ACL) REPAIR/arthroscopic;  Surgeon: Bjorn Pippin, MD;  Location: [facility];  Service: Orthopedics;  Laterality: Right;   NO PAST SURGERIES      OB History   No obstetric history on file.      Home Medications    Prior to Admission medications   Medication Sig Start Date End Date Taking? Authorizing Provider  amitriptyline (ELAVIL) 10 MG tablet Take 10 mg by mouth at bedtime.    [provider]  meloxicam (MOBIC) 15 MG tablet Take 1 tablet (15 mg total) by mouth daily. For 2 weeks for pain and inflammation. Then take as needed 04/17/22   Vernetta Honey, PA-C    Family History No family history on file.  Social History Social History   Tobacco Use   Smoking status: Former    Types: Cigarettes   Smokeless tobacco: Former  Building services engineer Use: Never used  Substance Use Topics   Alcohol use: Never   Drug  use: Never     Allergies   Sulfa antibiotics   Review of Systems Review of Systems  Constitutional:  Negative for fever.  Respiratory:  Negative for shortness of breath.   Cardiovascular:  Negative for chest pain and palpitations.       Bilateral foot swelling  Musculoskeletal:  Negative for gait problem.  Skin:  Negative for color change and wound.  Neurological:  Positive for headaches.  All other systems reviewed and are negative.    Physical Exam Triage Vital Signs ED Triage Vitals  Enc Vitals Group     BP 06/16/23 1443 128/82     Pulse Rate 06/16/23 1443 76     Resp --      Temp 06/16/23 1443 98.7 F (37.1 C)     Temp Source 06/16/23 1443 Oral     SpO2 06/16/23 1443 99 %     Weight --      Height --      Head Circumference --      Peak Flow --      Pain Score 06/16/23 1442 3     Pain Loc --      Pain Edu? --      Excl. in GC? --    No data found.  Updated Vital Signs BP 128/82 (BP Location: Left Arm)   Pulse  76   Temp 98.7 F (37.1 C) (Oral)   LMP 05/23/2023 (Exact Date)   SpO2 99%   Visual Acuity Right Eye Distance:   Left Eye Distance:   Bilateral Distance:    Right Eye Near:   Left Eye Near:    Bilateral Near:     Physical Exam Vitals and nursing note reviewed.  Constitutional:      General: She is not in acute distress.    Appearance: She is well-developed.     Comments: CBG was 84 in urgent care today  HENT:     Head: Normocephalic and atraumatic.  Eyes:     Conjunctiva/sclera: Conjunctivae normal.  Cardiovascular:     Rate and Rhythm: Normal rate and regular rhythm.     Pulses: Normal pulses.          Dorsalis pedis pulses are 2+ on the right side and 2+ on the left side.     Heart sounds: No murmur heard. Pulmonary:     Effort: Pulmonary effort is normal. No respiratory distress.  Abdominal:     Palpations: Abdomen is soft.     Tenderness: There is no abdominal tenderness.  Musculoskeletal:        General: No swelling.      Cervical back: Neck supple.  Skin:    General: Skin is warm and dry.     Capillary Refill: Capillary refill takes less than 2 seconds.  Neurological:     General: No focal deficit present.     Mental Status: She is alert and oriented to person, place, and time.     GCS: GCS eye subscore is 4. GCS verbal subscore is 5. GCS motor subscore is 6.     Cranial Nerves: Cranial nerves 2-12 are intact.     Sensory: Sensation is intact.     Motor: Motor function is intact.     Coordination: Coordination is intact.     Gait: Gait is intact.     Comments: No swelling of feet noted, no wound, no discoloration  Psychiatric:        Attention and Perception: Attention normal.        Mood and Affect: Mood normal.        Speech: Speech normal.        Behavior: Behavior is cooperative.      UC Treatments / Results  Labs (all labs ordered are listed, but only abnormal results are displayed) Labs Reviewed  CBG MONITORING, ED - Normal    EKG   Radiology No results found.  Procedures Procedures (including critical care time)  Medications Ordered in UC Medications - No data to display  Initial Impression / Assessment and Plan / UC Course  I have reviewed the triage vital signs and the nursing notes.  Pertinent labs & imaging results that were available during my care of the patient were reviewed by me and considered in my medical decision making (see chart for details).     Ddx: Headache, paresthesia, worried well Final Clinical Impressions(s) / UC Diagnoses   Final diagnoses:  Acute nonintractable headache, unspecified headache type  Paresthesia of both feet     Discharge Instructions      Please follow up with your PCP, do not skip meals, drink plenty of water. May take tylenol as label directed for headache.Return as needed. Go to Er for new or worsening issues or concerns.    ED Prescriptions   None    PDMP not reviewed this encounter.  Clancy Gourd,  NP 06/16/23 2052

## 2023-06-17 LAB — GLUCOSE, CAPILLARY: Glucose-Capillary: 84 mg/dL (ref 70–99)

## 2024-02-18 ENCOUNTER — Ambulatory Visit: Payer: Medicaid Other

## 2024-11-08 ENCOUNTER — Ambulatory Visit
Admission: EM | Admit: 2024-11-08 | Discharge: 2024-11-08 | Disposition: A | Discharge status: Home / Self Care | Attending: Emergency Medicine | Admitting: Emergency Medicine

## 2024-11-08 DIAGNOSIS — S161XXA Strain of muscle, fascia and tendon at neck level, initial encounter: Secondary | ICD-10-CM

## 2024-11-08 DIAGNOSIS — M62838 Other muscle spasm: Secondary | ICD-10-CM

## 2024-11-08 DIAGNOSIS — S39012A Strain of muscle, fascia and tendon of lower back, initial encounter: Secondary | ICD-10-CM

## 2024-11-08 MED ORDER — TIZANIDINE HCL 4 MG PO TABS: 4.0000 mg | ORAL_TABLET | Freq: Three times a day (TID) | ORAL | 0 refills | Status: AC | PRN

## 2024-11-08 MED ORDER — NAPROXEN 500 MG PO TABS
500.0000 mg | ORAL_TABLET | Freq: Two times a day (BID) | ORAL | 0 refills | Status: AC
Start: 2024-11-08 — End: ?

## 2024-11-08 MED ORDER — ACETAMINOPHEN 325 MG PO TABS
975.0000 mg | ORAL_TABLET | Freq: Once | ORAL | Status: AC
  Administered 2024-11-08: 975 mg via ORAL

## 2024-11-08 MED ORDER — KETOROLAC TROMETHAMINE 30 MG/ML IJ SOLN
30.0000 mg | Freq: Once | INTRAMUSCULAR | Status: AC
  Administered 2024-11-08: 30 mg via INTRAMUSCULAR

## 2024-11-08 NOTE — Discharge Instructions (Signed)
 People tend to feel worse over the next several days, but most people are back to normal in 1 week. A small number of people will have persistent pain for up to six weeks. Take the 500 mg of Naprosyn  combined with 1000 mg of tylenol  2 times a day. This is an extremely effective combination for pain.  Heat, gentle stretching, follow-up with  [facility] therapy in a week if not better.  Zanaflex for muscle spasms.  I am sending you home on prednisone as well for inflammation.  Go to www.goodrx.com  or www.costplusdrugs.com to look up your medications. This will give you a list of where you can find your prescriptions at the most affordable prices. Or ask the pharmacist what the cash price is, or if they have any other discount programs available to help make your medication more affordable. This can be less expensive than what you would pay with insurance.
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
